# Patient Record
Sex: Female | Born: 2000 | Race: White | Hispanic: No | Marital: Single | Smoking: Never smoker
Health system: Southern US, Community
[De-identification: ages and names within clinical notes are randomized; demographics above are authoritative.]

## PROBLEM LIST (undated history)

## (undated) DIAGNOSIS — F419 Anxiety disorder, unspecified: Secondary | ICD-10-CM

## (undated) DIAGNOSIS — R590 Localized enlarged lymph nodes: Secondary | ICD-10-CM

## (undated) DIAGNOSIS — J0301 Acute recurrent streptococcal tonsillitis: Secondary | ICD-10-CM

## (undated) HISTORY — DX: Anxiety disorder, unspecified: F41.9

## (undated) HISTORY — PX: TYMPANOSTOMY TUBE PLACEMENT: SHX32

---

## 2021-11-04 DIAGNOSIS — J020 Streptococcal pharyngitis: Secondary | ICD-10-CM | POA: Diagnosis not present

## 2021-11-21 ENCOUNTER — Ambulatory Visit: Payer: 59 | Admitting: Nurse Practitioner

## 2021-11-21 ENCOUNTER — Other Ambulatory Visit: Payer: Self-pay

## 2021-11-21 ENCOUNTER — Encounter: Payer: Self-pay | Admitting: Nurse Practitioner

## 2021-11-21 VITALS — BP 119/71 | HR 80 | Temp 98.6°F | Resp 18 | Ht 61.0 in | Wt 116.2 lb

## 2021-11-21 DIAGNOSIS — R590 Localized enlarged lymph nodes: Secondary | ICD-10-CM | POA: Diagnosis not present

## 2021-11-21 DIAGNOSIS — J351 Hypertrophy of tonsils: Secondary | ICD-10-CM | POA: Diagnosis not present

## 2021-11-21 NOTE — Progress Notes (Signed)
@Patient  ID: , female    DOB: 12-11-2000, 21 y.o.   MRN: [id number] ? ?Chief Complaint  ?Patient presents with  ? Establish Care  ?  Pt stated she just got over strep a week ago. She is here for lump right side of her throat very sore, face swelling, has extreme fatigue.   ? ? ?Referring provider: ?No ref. provider found ? ? ?HPI ? ?Patient presents today for lump to right neck.  She states that this has been present for the past 3 years.  She recently finished antibiotics for strep throat.  Patient states that she does have fatigue at times.  Patient is concerned for thyroid disease because it runs in the family.  We discussed that we can check some basic labs today and get her back in for complete physical in the near future.  We will check her thyroid level.  Her tonsils have been noted to be enlarged.  We will place a referral to ENT for further evaluation of neck and tonsils. Denies f/c/s, n/v/d, hemoptysis, PND, chest pain or edema. ? ? ? ? ?Not on File ? ? ?There is no immunization history on file for this patient. ? ?Past Medical History:  ?Diagnosis Date  ? Anxiety   ? ? ?Tobacco History: ?Social History  ? ?Tobacco Use  ?Smoking Status Never  ?Smokeless Tobacco Never  ? ?Counseling given: Not Answered ? ? ?Outpatient Encounter Medications as of 11/21/2021  ?Medication Sig  ? norethindrone-ethinyl estradiol (LOESTRIN) 1-20 MG-MCG tablet Take 1 tablet by mouth daily.  ? norethindrone-ethinyl estradiol (LOESTRIN) 1-20 MG-MCG tablet   ? spironolactone (ALDACTONE) 25 MG tablet Take by mouth.  ? ?No facility-administered encounter medications on file as of 11/21/2021.  ? ? ? ?Review of Systems ? ?Review of Systems  ?Constitutional: Negative.   ?HENT: Negative.    ?Cardiovascular: Negative.   ?Gastrointestinal: Negative.   ?Allergic/Immunologic: Negative.   ?Neurological: Negative.   ?Psychiatric/Behavioral: Negative.     ? ? ? ?Physical Exam ? ?BP 119/71 (BP Location: Right Arm, Patient Position: Sitting,  Cuff Size: Normal)   Pulse 80   Temp 98.6 ?F (37 ?C)   Resp 18   Ht 5\' 1"  (1.549 m)   Wt 116 lb 4 oz (52.7 kg)   LMP 11/12/2021 (Approximate)   SpO2 98%   BMI 21.97 kg/m?  ? ?Wt Readings from Last 5 Encounters:  ?11/21/21 116 lb 4 oz (52.7 kg)  ? ? ? ?Physical Exam ?Vitals and nursing note reviewed.  ?Constitutional:   ?   General: She is not in acute distress. ?   Appearance: She is well-developed.  ?HENT:  ?   Mouth/Throat:  ?   Comments: Tonsillar lymph node slightly enlarged. Bilateral tonsils enlarged.  ?Cardiovascular:  ?   Rate and Rhythm: Normal rate and regular rhythm.  ?Pulmonary:  ?   Effort: Pulmonary effort is normal.  ?   Breath sounds: Normal breath sounds.  ?Neurological:  ?   Mental Status: She is alert and oriented to person, place, and time.  ? ? ? ? ? ?Assessment & Plan:  ? ?Enlarged lymph node in neck ?Issues x 3 years - will refer to ENT for further evaluation ? ?- Ambulatory referral to ENT ?- CBC ?- Comprehensive metabolic panel ?- TSH ? ? ?2. Enlarged tonsils ? ? ?- Ambulatory referral to ENT ?- CBC ?- Comprehensive metabolic panel ?- TSH ? ? ?Follow up: ? ?Follow up  ? ? ? ? ?11/14/2021,  NP ?11/25/2021 ? ?

## 2021-11-21 NOTE — Patient Instructions (Signed)
1. Enlarged lymph node in neck ? ?Issues x 3 years - will refer to ENT for further evaluation ? ?- Ambulatory referral to ENT ?- CBC ?- Comprehensive metabolic panel ?- TSH ? ? ?2. Enlarged tonsils ? ? ?- Ambulatory referral to ENT ?- CBC ?- Comprehensive metabolic panel ?- TSH ? ? ?Follow up: ? ?Follow up  ?

## 2021-11-22 LAB — COMPREHENSIVE METABOLIC PANEL
ALT: 11 IU/L (ref 0–32)
AST: 16 IU/L (ref 0–40)
Albumin/Globulin Ratio: 1.7 (ref 1.2–2.2)
Albumin: 4.3 g/dL (ref 3.9–5.0)
Alkaline Phosphatase: 74 IU/L (ref 44–121)
BUN/Creatinine Ratio: 13 (ref 9–23)
BUN: 12 mg/dL (ref 6–20)
Bilirubin Total: 0.3 mg/dL (ref 0.0–1.2)
CO2: 22 mmol/L (ref 20–29)
Calcium: 9.9 mg/dL (ref 8.7–10.2)
Chloride: 101 mmol/L (ref 96–106)
Creatinine, Ser: 0.95 mg/dL (ref 0.57–1.00)
Globulin, Total: 2.5 g/dL (ref 1.5–4.5)
Glucose: 66 mg/dL — ABNORMAL LOW (ref 70–99)
Potassium: 4.3 mmol/L (ref 3.5–5.2)
Sodium: 138 mmol/L (ref 134–144)
Total Protein: 6.8 g/dL (ref 6.0–8.5)
eGFR: 87 mL/min/{1.73_m2} (ref >59)

## 2021-11-22 LAB — CBC
Hematocrit: 42.8 % (ref 34.0–46.6)
Hemoglobin: 15 g/dL (ref 11.1–15.9)
MCH: 34.1 pg — ABNORMAL HIGH (ref 26.6–33.0)
MCHC: 35 g/dL (ref 31.5–35.7)
MCV: 97 fL (ref 79–97)
Platelets: 266 10*3/uL (ref 150–450)
RBC: 4.4 x10E6/uL (ref 3.77–5.28)
RDW: 11.8 % (ref 11.7–15.4)
WBC: 8.1 10*3/uL (ref 3.4–10.8)

## 2021-11-22 LAB — TSH: TSH: 2.14 u[IU]/mL (ref 0.450–4.500)

## 2021-11-25 ENCOUNTER — Encounter: Payer: Self-pay | Admitting: Nurse Practitioner

## 2021-11-25 DIAGNOSIS — R590 Localized enlarged lymph nodes: Secondary | ICD-10-CM | POA: Insufficient documentation

## 2021-11-25 NOTE — Assessment & Plan Note (Signed)
Issues x 3 years - will refer to ENT for further evaluation ? ?- Ambulatory referral to ENT ?- CBC ?- Comprehensive metabolic panel ?- TSH ? ? ?2. Enlarged tonsils ? ? ?- Ambulatory referral to ENT ?- CBC ?- Comprehensive metabolic panel ?- TSH ? ? ?Follow up: ? ?Follow up  ?

## 2021-12-03 ENCOUNTER — Ambulatory Visit: Payer: Self-pay | Admitting: *Deleted

## 2021-12-03 NOTE — Telephone Encounter (Signed)
?  Chief Complaint: enlarged lymph nodes bigger , requesting advise  ?Symptoms: right side of neck lymph nodes swollen size of golf ball, left side swollen less. Facial swelling noted. Low grade fever, headache, tender to touch, pain moving neck side to side.  ?Frequency: bigger since 11/21/21 ?Pertinent Negatives: Patient denies chest pain  no difficulty breathing, no difficulty swallowing  ?Disposition: [x] ED /[] Urgent Care (no appt availability in office) / [] Appointment(In office/virtual)/ []  [facility]/ [] Home Care/ [] Refused Recommended Disposition /[] [facility]/ []  Follow-up with PCP ?Additional Notes:  ?Awaiting ENT to call for appt.  ? ? ? Reason for Disposition ? [1] Single large node AND [2] size > 1 inch (2.5 cm) AND [3] no fever ? ?Answer Assessment - Initial Assessment Questions ?1. LOCATION: "Where is the swollen node located?" "Is the matching node on the other side of the body also swollen?"  ?    Right side of neck lymph node larger than left side ?2. SIZE: "How big is the node?" (e.g., inches or centimeters; or compared to common objects such as pea, bean, marble, golf ball)  ?    Right lymph node golf ball size ?3. ONSET: "When did the swelling start?"  ?    Has been swelling since 11/21/21 and getting larger ?4. NECK NODES: "Is there a sore throat, runny nose or other symptoms of a cold?"  ?    Was treated with antibiotics and seen in [facility] 11/21/21 ?5. GROIN OR ARMPIT NODES: "Is there a sore, scratch, cut or painful red area on that arm or leg?"  ?    No  ?6. FEVER: "Do you have a fever?" If Yes, ask: "What is it, how was it measured, and when did it start?"  ?    Low grade fever ?7. CAUSE: "What do you think is causing the swollen lymph nodes?" ?    Not sure  ?8. OTHER SYMPTOMS: "Do you have any other symptoms?" ?    Tonsils inflammed.  Facial swelling headache, neck stiffness moving side to side. ?9. PREGNANCY: "Is there any chance you are pregnant?" "When was your last  menstrual period?" ?    na ? ?Protocols used: Lymph Nodes - Swollen-A-AH ? ?

## 2021-12-04 ENCOUNTER — Emergency Department (HOSPITAL_COMMUNITY): Payer: 59

## 2021-12-04 ENCOUNTER — Emergency Department (HOSPITAL_COMMUNITY)
Admission: EM | Admit: 2021-12-04 | Discharge: 2021-12-04 | Disposition: A | Discharge status: Home / Self Care | Payer: 59 | Attending: Emergency Medicine | Admitting: Emergency Medicine

## 2021-12-04 ENCOUNTER — Other Ambulatory Visit: Payer: Self-pay

## 2021-12-04 DIAGNOSIS — R590 Localized enlarged lymph nodes: Secondary | ICD-10-CM | POA: Insufficient documentation

## 2021-12-04 DIAGNOSIS — J358 Other chronic diseases of tonsils and adenoids: Secondary | ICD-10-CM | POA: Insufficient documentation

## 2021-12-04 DIAGNOSIS — J351 Hypertrophy of tonsils: Secondary | ICD-10-CM | POA: Diagnosis not present

## 2021-12-04 DIAGNOSIS — M542 Cervicalgia: Secondary | ICD-10-CM | POA: Diagnosis present

## 2021-12-04 LAB — CBC WITH DIFFERENTIAL/PLATELET
Abs Immature Granulocytes: 0.02 10*3/uL (ref 0.00–0.07)
Basophils Absolute: 0 10*3/uL (ref 0.0–0.1)
Basophils Relative: 1 %
Eosinophils Absolute: 0.1 10*3/uL (ref 0.0–0.5)
Eosinophils Relative: 1 %
HCT: 40.6 % (ref 36.0–46.0)
Hemoglobin: 14.5 g/dL (ref 12.0–15.0)
Immature Granulocytes: 0 %
Lymphocytes Relative: 21 %
Lymphs Abs: 1.7 10*3/uL (ref 0.7–4.0)
MCH: 33.6 pg (ref 26.0–34.0)
MCHC: 35.7 g/dL (ref 30.0–36.0)
MCV: 94.2 fL (ref 80.0–100.0)
Monocytes Absolute: 0.6 10*3/uL (ref 0.1–1.0)
Monocytes Relative: 8 %
Neutro Abs: 5.5 10*3/uL (ref 1.7–7.7)
Neutrophils Relative %: 69 %
Platelets: 260 10*3/uL (ref 150–400)
RBC: 4.31 MIL/uL (ref 3.87–5.11)
RDW: 11.8 % (ref 11.5–15.5)
WBC: 8 10*3/uL (ref 4.0–10.5)
nRBC: 0 % (ref 0.0–0.2)

## 2021-12-04 LAB — I-STAT BETA HCG BLOOD, ED (MC, WL, AP ONLY): I-stat hCG, quantitative: <5 m[IU]/mL (ref <5)

## 2021-12-04 LAB — BASIC METABOLIC PANEL
Anion gap: 7 (ref 5–15)
BUN: 9 mg/dL (ref 6–20)
CO2: 27 mmol/L (ref 22–32)
Calcium: 9.3 mg/dL (ref 8.9–10.3)
Chloride: 106 mmol/L (ref 98–111)
Creatinine, Ser: 0.82 mg/dL (ref 0.44–1.00)
GFR, Estimated: >60 mL/min (ref >60)
Glucose, Bld: 89 mg/dL (ref 70–99)
Potassium: 4.4 mmol/L (ref 3.5–5.1)
Sodium: 140 mmol/L (ref 135–145)

## 2021-12-04 LAB — GROUP A STREP BY PCR: Group A Strep by PCR: DETECTED — AB

## 2021-12-04 LAB — MONONUCLEOSIS SCREEN: Mono Screen: NEGATIVE

## 2021-12-04 MED ORDER — CYCLOBENZAPRINE HCL 10 MG PO TABS: 10.0000 mg | ORAL_TABLET | Freq: Every evening | ORAL | 0 refills | Status: AC | PRN

## 2021-12-04 MED ORDER — IOHEXOL 300 MG/ML  SOLN
75.0000 mL | Freq: Once | INTRAMUSCULAR | Status: AC | PRN
  Administered 2021-12-04: 75 mL via INTRAVENOUS

## 2021-12-04 NOTE — ED Triage Notes (Signed)
Pt. Stated, Desiree Ramsey had a lump in my neck for 2-3 years and now Im having neck pain for 4 days. ?

## 2021-12-04 NOTE — ED Provider Notes (Signed)
?[facility] EMERGENCY DEPARTMENT ?Provider Note ? ? ?CSN: [id number] ?Arrival date & time: 12/04/21  1044 ? ?  ? ?History ? ?Chief Complaint  ?Patient presents with  ? Neck Pain  ? ? ?Desiree Ramsey is a 21 y.o. female. ? ?Patient is a 21 year old female with no significant medical problems presenting today with concerns for neck pain, swelling in her neck and recurrent ongoing illnesses including strep throat last week.  Patient was on 10 days of antibiotic which she was taking twice daily but noticed in the last week she has had worsening swelling in her neck which makes it painful for her to lay down and move her head.  She has been taking over-the-counter medications with minimal improvement.  She has not had any fever, cough or congestion.  She occasionally has pain with swallowing but reports that is significantly improved after being on the antibiotic.  She reports for several years now she has noticed an intermittent lymph node in the right side of her neck that will swell when she gets sick but does not consistently stay enlarged.  It is never hurt before.  She denies any recent weight loss.  She had followed with her pediatrician for this and they never found any acute issues.  She denies any abdominal pain, weight loss, nausea or vomiting.  No shortness of breath or respiratory symptoms. ? ?The history is provided by the patient.  ?Neck Pain ? ?  ? ?Home Medications ?Prior to Admission medications   ?Medication Sig Start Date End Date Taking? Authorizing Provider  ?cyclobenzaprine (FLEXERIL) 10 MG tablet Take 1 tablet (10 mg total) by mouth at bedtime as needed for muscle spasms. 12/04/21  Yes Gwyneth Sprout, MD  ?norethindrone-ethinyl estradiol (LOESTRIN) 1-20 MG-MCG tablet Take 1 tablet by mouth daily. 09/18/21   [provider]  ?norethindrone-ethinyl estradiol (LOESTRIN) 1-20 MG-MCG tablet  01/09/21   [provider]  ?spironolactone (ALDACTONE) 25 MG tablet Take by  mouth.    [provider]  ?   ? ?Allergies    ?Patient has no allergy information on record.   ? ?Review of Systems   ?Review of Systems  ?Musculoskeletal:  Positive for neck pain.  ? ?Physical Exam ?Updated Vital Signs ?BP 108/63   Pulse 80   Temp 98.5 ?F (36.9 ?C) (Oral)   Resp 16   LMP 11/12/2021 (Approximate)   SpO2 98%  ?Physical Exam ?Vitals and nursing note reviewed.  ?Constitutional:   ?   General: She is not in acute distress. ?   Appearance: She is well-developed.  ?HENT:  ?   Head: Normocephalic and atraumatic.  ?   Mouth/Throat:  ?   Comments: No tonsillar exudates, enlargement or swelling ?Eyes:  ?   Pupils: Pupils are equal, round, and reactive to light.  ?Neck:  ?   Thyroid: No thyromegaly or thyroid tenderness.  ? ?   Comments: No supraclavicular lymph nodes present.  No stridor or voice changes. ?Cardiovascular:  ?   Rate and Rhythm: Normal rate and regular rhythm.  ?   Heart sounds: Normal heart sounds. No murmur heard. ?  No friction rub.  ?Pulmonary:  ?   Effort: Pulmonary effort is normal.  ?   Breath sounds: Normal breath sounds. No wheezing or rales.  ?Abdominal:  ?   General: Bowel sounds are normal. There is no distension.  ?   Palpations: Abdomen is soft.  ?   Tenderness: There is no abdominal tenderness. There is  no guarding or rebound.  ?Musculoskeletal:     ?   General: No tenderness. Normal range of motion.  ?   Cervical back: Normal range of motion and neck supple.  ?   Right lower leg: No edema.  ?   Left lower leg: No edema.  ?   Comments: No edema  ?Lymphadenopathy:  ?   Cervical: Cervical adenopathy present.  ?   Right cervical: Superficial cervical adenopathy present.  ?   Left cervical: Superficial cervical adenopathy present.  ?Skin: ?   General: Skin is warm and dry.  ?   Findings: No rash.  ?Neurological:  ?   Mental Status: She is alert and oriented to person, place, and time.  ?   Cranial Nerves: No cranial nerve deficit.  ?Psychiatric:     ?   Behavior:  Behavior normal.  ? ? ?ED Results / Procedures / Treatments   ?Labs ?(all labs ordered are listed, but only abnormal results are displayed) ?Labs Reviewed  ?GROUP A STREP BY PCR - Abnormal; Notable for the following components:  ?    Result Value  ? Group A Strep by PCR DETECTED (*)   ? All other components within normal limits  ?CBC WITH DIFFERENTIAL/PLATELET  ?BASIC METABOLIC PANEL  ?MONONUCLEOSIS SCREEN  ?I-STAT BETA HCG BLOOD, ED ([facility], [facility], [facility] ONLY)  ? ? ?EKG ?None ? ?Radiology ?CT Soft Tissue Neck W Contrast ? ?Result Date: 12/04/2021 ?CLINICAL DATA:  Soft tissue swelling, infection suspected, neck xray done EXAM: CT NECK WITH CONTRAST TECHNIQUE: Multidetector CT imaging of the neck was performed using the standard protocol following the bolus administration of intravenous contrast. RADIATION DOSE REDUCTION: This exam was performed according to the departmental dose-optimization program which includes automated exposure control, adjustment of the mA and/or kV according to patient size and/or use of iterative reconstruction technique. CONTRAST:  75mL OMNIPAQUE IOHEXOL 300 MG/ML  SOLN COMPARISON:  None. FINDINGS: Pharynx and larynx: Enlarged palatine tonsils bilaterally. No surrounding edema or discrete, drainable fluid collection. Unremarkable appearance of the larynx. Salivary glands: No inflammation, mass, or stone. Thyroid: Normal. Lymph nodes: Enlarged left greater than right upper cervical chain nodes, including 1.6 x 3.1 Cm short axis left upper neck node. Vascular: Negative. Limited intracranial: Negative. Visualized orbits: Negative. Mastoids and visualized paranasal sinuses: Clear. Skeleton: No evidence of acute abnormality. Upper chest: Visualized lung apices are clear. IMPRESSION: 1. Enlarged bilateral cervical chain nodes, largest being a node in the upper left neck. Differential considerations include reactive adenopathy versus lymphoproliferative disorder. Soft tissue sampling may be indicated if they  don't resolve with conservative management. 2. Mildly prominent palatine tonsils, which is nonspecific. Recommend correlation with direct inspection. Electronically Signed   By: Feliberto HartsFrederick S Jones M.D.   On: 12/04/2021 14:47   ? ?Procedures ?Procedures  ? ? ?Medications Ordered in ED ?Medications  ?iohexol (OMNIPAQUE) 300 MG/ML solution 75 mL (75 mLs Intravenous Contrast Given 12/04/21 1426)  ? ? ?ED Course/ Medical Decision Making/ A&P ?  ?                        ?Medical Decision Making ?Amount and/or Complexity of Data Reviewed ?External Data Reviewed: notes. ?Labs: ordered. Decision-making details documented in ED Course. ?Radiology: ordered and independent interpretation performed. Decision-making details documented in ED Course. ? ?Risk ?Prescription drug management. ? ? ?21 year old female with recent strep infection presenting today due to anterior neck pain and mass.  Patient has mass noted in the left  anterior neck concern for possible enlarged parotid gland.  Also lymph node palpated in the right side of the neck.  Tenderness with palpation.  Concern for possible viral etiology versus lymphoma versus parotitis versus mono.  Patient did test strep positive however she was just treated for strep and does not have significant findings of strep at this time.  Low suspicion for RPA, epiglottitis or PTA.  Discussed with the patient and her mom further evaluation with CT soft tissue neck for further evaluation.  I independently reviewed patient's labs and her white count is within normal limits without signs concerning for leukemia, CMP within normal limits.  Patient's mono is negative.  I independently visualized and interpreted patient's CT without evidence of abscess.  Radiology reports 1. Enlarged bilateral cervical chain nodes, largest being a node in  ?the upper left neck. Differential considerations include reactive  ?adenopathy versus lymphoproliferative disorder. Soft tissue sampling  ?may be indicated if  they don't resolve with conservative management.  ?2. Mildly prominent palatine tonsils, which is nonspecific.  ?Recommend correlation with direct inspection.  Findings discussed with the patient and her mom.  F

## 2021-12-05 ENCOUNTER — Encounter: Payer: Self-pay | Admitting: Nurse Practitioner

## 2021-12-05 ENCOUNTER — Ambulatory Visit (INDEPENDENT_AMBULATORY_CARE_PROVIDER_SITE_OTHER): Payer: 59 | Admitting: Nurse Practitioner

## 2021-12-05 DIAGNOSIS — R590 Localized enlarged lymph nodes: Secondary | ICD-10-CM

## 2021-12-05 NOTE — Progress Notes (Signed)
Virtual Visit via Telephone Note ? ?I connected with Desiree Ramsey on 12/05/21 at  3:00 PM EDT by telephone and verified that I am speaking with the correct person using two identifiers. ? ?Location: ?Patient: home ?Provider: office ?  ?I discussed the limitations, risks, security and privacy concerns of performing an evaluation and management service by telephone and the availability of in person appointments. I also discussed with the patient that there may be a patient responsible charge related to this service. The patient expressed understanding and agreed to proceed. ? ? ?History of Present Illness: ? ?Patient presents today for follow-up visit through telephone visit.  Patient was seen by me on 11/21/2021 for enlarged lymph nodes and enlarged tonsils.  A referral was placed at that visit for ENT.  Patient states that she never heard back from the ENT for the appointment.  She states that she felt like her lymph nodes were becoming more inflamed and enlarged.  She went to the ED yesterday and CT showed enlarged lymph nodes and suggested possible need for biopsy.  Patient is following up today to see if we could possibly get her an appointment made as soon as possible with the ENT to follow-up on this issue.  She does have an appointment set up for 01/21/2022 as per referral placed at her last visit.  We will call the ENT office to see if we can have her seen urgently.  She has been having this issue for the past 3 years.  She did recently have strep throat which she has been treated for with a round of antibiotics. Denies f/c/s, n/v/d, hemoptysis, PND, chest pain or edema. ? ? ?  ?Observations/Objective: ? ? ?  12/04/2021  ? 10:51 AM 11/21/2021  ? 10:13 AM  ?Vitals with BMI  ?Height  5\' 1"   ?Weight  116 lbs 4 oz  ?BMI  21.98  ?Systolic 108 119  ?Diastolic 63 71  ?Pulse 80 80  ? ? ? ? ?Assessment and Plan: ? ?1. Enlarged lymph node in neck ? ?Will call ENT to see if appt can be re-scheduled for a sooner date ? ?Follow  up: ? ?Follow up if needed ? ? ?  ?I discussed the assessment and treatment plan with the patient. The patient was provided an opportunity to ask questions and all were answered. The patient agreed with the plan and demonstrated an understanding of the instructions. ?  ?The patient was advised to call back or seek an in-person evaluation if the symptoms worsen or if the condition fails to improve as anticipated. ? ?I provided 25 minutes of non-face-to-face time during this encounter. ? ? ? , NP ? ?

## 2021-12-05 NOTE — Patient Instructions (Addendum)
1. Enlarged lymph node in neck ? ?Will call ENT to see if appt can be re-scheduled for a sooner date ? ?Follow up: ? ?Follow up if needed ? ? ?

## 2021-12-05 NOTE — Progress Notes (Signed)
Patient reports swollen lymph nodes recurring after visit with Tanda Rockers on 11/21/21.  ?MA spoke with ENT and patient is scheduled for 01/21/22 at 11am with Brynda Peon at [facility] ?

## 2021-12-12 DIAGNOSIS — R590 Localized enlarged lymph nodes: Secondary | ICD-10-CM | POA: Diagnosis not present

## 2021-12-12 DIAGNOSIS — Z87898 Personal history of other specified conditions: Secondary | ICD-10-CM | POA: Diagnosis not present

## 2021-12-12 DIAGNOSIS — J0301 Acute recurrent streptococcal tonsillitis: Secondary | ICD-10-CM | POA: Diagnosis not present

## 2022-01-01 NOTE — Progress Notes (Deleted)
Patient ID: Desiree Ramsey, female    DOB: 02/12/2001  MRN: [id number]  CC: Annual Physical Exam  Subjective: Desiree Ramsey is a 21 y.o. female who presents for annual physical exam.   Her concerns today include:  Need PAP   Patient Active Problem List   Diagnosis Date Noted   Enlarged lymph node in neck 11/25/2021     Current Outpatient Medications on File Prior to Visit  Medication Sig Dispense Refill   cyclobenzaprine (FLEXERIL) 10 MG tablet Take 1 tablet (10 mg total) by mouth at bedtime as needed for muscle spasms. 20 tablet 0   norethindrone-ethinyl estradiol (LOESTRIN) 1-20 MG-MCG tablet Take 1 tablet by mouth daily.     norethindrone-ethinyl estradiol (LOESTRIN) 1-20 MG-MCG tablet      spironolactone (ALDACTONE) 25 MG tablet Take by mouth.     No current facility-administered medications on file prior to visit.    Not on File  Social History   Socioeconomic History   Marital status: Single    Spouse name: Not on file   Number of children: Not on file   Years of education: Not on file   Highest education level: Not on file  Occupational History   Not on file  Tobacco Use   Smoking status: Never   Smokeless tobacco: Never  Vaping Use   Vaping Use: Every day  Substance and Sexual Activity   Alcohol use: Yes    Comment: social drinker   Drug use: Yes    Types: Marijuana   Sexual activity: Yes  Other Topics Concern   Not on file  Social History Narrative   Not on file   Social Determinants of Health   Financial Resource Strain: Not on file  Food Insecurity: Not on file  Transportation Needs: Not on file  Physical Activity: Not on file  Stress: Not on file  Social Connections: Not on file  Intimate Partner Violence: Not on file    Family History  Problem Relation Age of Onset   Hypertension Father     No past surgical history on file.  ROS: Review of Systems Negative except as stated above  PHYSICAL EXAM: There were no vitals taken for  this visit.  Physical Exam  {female adult master:310786} {female adult master:310785}     Latest Ref Rng & Units 12/04/2021   11:26 AM 11/21/2021   10:53 AM  CMP  Glucose 70 - 99 mg/dL 89   66    BUN 6 - 20 mg/dL 9   12    Creatinine 0.44 - 1.00 mg/dL 0.82   0.95    Sodium 135 - 145 mmol/L 140   138    Potassium 3.5 - 5.1 mmol/L 4.4   4.3    Chloride 98 - 111 mmol/L 106   101    CO2 22 - 32 mmol/L 27   22    Calcium 8.9 - 10.3 mg/dL 9.3   9.9    Total Protein 6.0 - 8.5 g/dL  6.8    Total Bilirubin 0.0 - 1.2 mg/dL  0.3    Alkaline Phos 44 - 121 IU/L  74    AST 0 - 40 IU/L  16    ALT 0 - 32 IU/L  11     Lipid Panel  No results found for: CHOL, TRIG, HDL, CHOLHDL, VLDL, LDLCALC, LDLDIRECT  CBC    Component Value Date/Time   WBC 8.0 12/04/2021 1126   RBC 4.31 12/04/2021 1126   HGB  14.5 12/04/2021 1126   HGB 15.0 11/21/2021 1053   HCT 40.6 12/04/2021 1126   HCT 42.8 11/21/2021 1053   PLT 260 12/04/2021 1126   PLT 266 11/21/2021 1053   MCV 94.2 12/04/2021 1126   MCV 97 11/21/2021 1053   MCH 33.6 12/04/2021 1126   MCHC 35.7 12/04/2021 1126   RDW 11.8 12/04/2021 1126   RDW 11.8 11/21/2021 1053   LYMPHSABS 1.7 12/04/2021 1126   MONOABS 0.6 12/04/2021 1126   EOSABS 0.1 12/04/2021 1126   BASOSABS 0.0 12/04/2021 1126    ASSESSMENT AND PLAN:  There are no diagnoses linked to this encounter.   Patient was given the opportunity to ask questions.  Patient verbalized understanding of the plan and was able to repeat key elements of the plan. Patient was given clear instructions to go to Emergency Department or return to medical center if symptoms don't improve, worsen, or new problems develop.The patient verbalized understanding.   No orders of the defined types were placed in this encounter.    Requested Prescriptions    No prescriptions requested or ordered in this encounter    No follow-ups on file.  Camillia Herter, NP

## 2022-01-06 ENCOUNTER — Ambulatory Visit: Payer: 59 | Admitting: Family

## 2022-01-06 DIAGNOSIS — Z13220 Encounter for screening for lipoid disorders: Secondary | ICD-10-CM

## 2022-01-06 DIAGNOSIS — Z13228 Encounter for screening for other metabolic disorders: Secondary | ICD-10-CM

## 2022-01-06 DIAGNOSIS — Z131 Encounter for screening for diabetes mellitus: Secondary | ICD-10-CM

## 2022-01-06 DIAGNOSIS — Z113 Encounter for screening for infections with a predominantly sexual mode of transmission: Secondary | ICD-10-CM

## 2022-01-06 DIAGNOSIS — Z0000 Encounter for general adult medical examination without abnormal findings: Secondary | ICD-10-CM

## 2022-01-06 DIAGNOSIS — Z114 Encounter for screening for human immunodeficiency virus [HIV]: Secondary | ICD-10-CM

## 2022-01-06 DIAGNOSIS — Z1159 Encounter for screening for other viral diseases: Secondary | ICD-10-CM

## 2022-01-06 DIAGNOSIS — Z124 Encounter for screening for malignant neoplasm of cervix: Secondary | ICD-10-CM

## 2022-01-09 ENCOUNTER — Encounter (HOSPITAL_BASED_OUTPATIENT_CLINIC_OR_DEPARTMENT_OTHER): Payer: Self-pay | Admitting: Otolaryngology

## 2022-01-09 ENCOUNTER — Other Ambulatory Visit: Payer: Self-pay

## 2022-01-10 NOTE — H&P (Signed)
HPI:  ? ?Desiree Ramsey is a 21 y.o. female who presents as a consult Patient.  ? ?Referring Provider: Fabio Neighbors, F* ? ?Chief complaint: Lump in the neck and recurrent strep. ? ?HPI: Very healthy young lady, currently a Presenter, broadcasting, has been sick on a monthly basis for most of her life including upper respiratory infections and strep throat. Her last episode of strep was a few weeks ago. She discovered a large lymph node in her right neck about 3 years ago. About a week ago she was having so much pain in her neck that she went to the emergency department and had a CT which revealed bilateral adenopathy. He was much better today. She does not smoke. ? ?PMH/Meds/All/SocHx/FamHx/ROS:  ? ?History reviewed. No pertinent past medical history. ? ?History reviewed. No pertinent surgical history. ? ?No family history of bleeding disorders, wound healing problems or difficulty with anesthesia.  ? ?Social History  ? ?Socioeconomic History  ? Marital status: Single  ?Spouse name: Not on file  ? Number of children: Not on file  ? Years of education: Not on file  ? Highest education level: Not on file  ?Occupational History  ? Not on file  ?Tobacco Use  ? Smoking status: Never  ? Smokeless tobacco: Never  ?Vaping Use  ? Vaping Use: Former  ?Substance and Sexual Activity  ? Alcohol use: Yes  ? Drug use: Not on file  ? Sexual activity: Not on file  ?Other Topics Concern  ? Not on file  ?Social History Narrative  ? Not on file  ? ?Social Determinants of Health  ? ?Financial Resource Strain: Not on file  ?Food Insecurity: Not on file  ?Transportation Needs: Not on file  ?Physical Activity: Not on file  ?Stress: Not on file  ?Social Connections: Not on file  ?Housing Stability: Not on file  ? ?Current Outpatient Medications:  ? norethindrone-ethinyl estradiol (JUNEL 1/20) 1-20 mg-mcg per tablet, , Disp: , Rfl:  ? spironolactone (ALDACTONE) 25 MG tablet, Take 1 tablet (25 mg total) by mouth daily., Disp: , Rfl:  ?  escitalopram oxalate (LEXAPRO) 10 MG tablet, Take 1 tablet (10 mg total) by mouth daily., Disp: , Rfl:  ? fluticasone propionate (FLONASE) 50 mcg/actuation nasal spray, Administer 2 sprays into affected nostril., Disp: , Rfl:  ? levocetirizine (XYZAL) 5 MG tablet, Take 1 tablet (5 mg total) by mouth., Disp: , Rfl:  ? ?A complete ROS was performed with pertinent positives/negatives noted in the HPI. The remainder of the ROS are negative. ? ? ?Physical Exam:  ? ?There were no vitals taken for this visit. ? ?General: Healthy and alert, in no distress, breathing easily. Normal affect. In a pleasant mood. ?Head: Normocephalic, atraumatic. No masses, or scars. ?Eyes: Pupils are equal, and reactive to light. Vision is grossly intact. No spontaneous or gaze nystagmus. ?Ears: Ear canals are clear. Tympanic membranes are intact, with normal landmarks and the middle ears are clear and healthy. ?Hearing: Grossly normal. ?Nose: Nasal cavities are clear with healthy mucosa, no polyps or exudate. Airways are patent. ?Face: No masses or scars, facial nerve function is symmetric. ?Oral Cavity: No mucosal abnormalities are noted. Tongue with normal mobility. Dentition appears healthy. ?Oropharynx: Tonsils are symmetric. There are no mucosal masses identified. Tongue base appears normal and healthy. ?Larynx/Hypopharynx: deferred ?Chest: Deferred ?Neck: Bilateral large level 2 nodes, with symmetry, nontender, no other palpable masses, no cervical adenopathy, no thyroid nodules or enlargement. ?Neuro: Cranial nerves II-XII with normal function. ?Balance:  Normal gate. ?Other findings: none. ? ?Independent Review of Additional Tests or Records:  ?CT of neck: ? ?IMPRESSION:  ?1. Enlarged bilateral cervical chain nodes, largest being a node in  ?the upper left neck. Differential considerations include reactive  ?adenopathy versus lymphoproliferative disorder. Soft tissue sampling  ?may be indicated if they don't resolve with conservative  management.  ?2. Mildly prominent palatine tonsils, which is nonspecific.  ?Recommend correlation with direct inspection.  ? ?Procedures:  ?none ? ?Impression & Plans:  ?Bilateral cervical lymphadenopathy, in the presence of history of recurrent strep and other upper respiratory infections. She has had mono twice. She has had COVID 5 times. I would recommend tonsillectomy. I would also put her on an antibiotic now since her strep test was positive last week but she was not treated for some reason. I think we take her tonsils out and the lymphadenopathy resolves then we are finished. If not then we may have to work her up for lymphoproliferative disorder.Dalyn meets the indications for tonsillectomy. Risks and benefits were discussed in detail. All questions were answered. A handout was provided with additional details.  ?

## 2022-01-20 ENCOUNTER — Ambulatory Visit (HOSPITAL_BASED_OUTPATIENT_CLINIC_OR_DEPARTMENT_OTHER): Payer: 59 | Admitting: Anesthesiology

## 2022-01-20 ENCOUNTER — Encounter (HOSPITAL_BASED_OUTPATIENT_CLINIC_OR_DEPARTMENT_OTHER): Payer: Self-pay | Admitting: Otolaryngology

## 2022-01-20 ENCOUNTER — Encounter (HOSPITAL_BASED_OUTPATIENT_CLINIC_OR_DEPARTMENT_OTHER)
Admission: RE | Disposition: A | Discharge status: Home / Self Care | Payer: Self-pay | Source: Home / Self Care | Attending: Otolaryngology

## 2022-01-20 ENCOUNTER — Ambulatory Visit (HOSPITAL_BASED_OUTPATIENT_CLINIC_OR_DEPARTMENT_OTHER)
Admission: RE | Admit: 2022-01-20 | Discharge: 2022-01-20 | Disposition: A | Discharge status: Home / Self Care | Payer: 59 | Attending: Otolaryngology | Admitting: Otolaryngology

## 2022-01-20 DIAGNOSIS — J0301 Acute recurrent streptococcal tonsillitis: Secondary | ICD-10-CM | POA: Insufficient documentation

## 2022-01-20 DIAGNOSIS — Z8616 Personal history of COVID-19: Secondary | ICD-10-CM | POA: Insufficient documentation

## 2022-01-20 DIAGNOSIS — Z9089 Acquired absence of other organs: Secondary | ICD-10-CM

## 2022-01-20 DIAGNOSIS — F419 Anxiety disorder, unspecified: Secondary | ICD-10-CM | POA: Diagnosis not present

## 2022-01-20 DIAGNOSIS — R590 Localized enlarged lymph nodes: Secondary | ICD-10-CM | POA: Diagnosis not present

## 2022-01-20 DIAGNOSIS — Z8619 Personal history of other infectious and parasitic diseases: Secondary | ICD-10-CM | POA: Insufficient documentation

## 2022-01-20 HISTORY — DX: Localized enlarged lymph nodes: R59.0

## 2022-01-20 HISTORY — DX: Acute recurrent streptococcal tonsillitis: J03.01

## 2022-01-20 HISTORY — PX: TONSILLECTOMY: SHX5217

## 2022-01-20 LAB — POCT PREGNANCY, URINE: Preg Test, Ur: NEGATIVE

## 2022-01-20 SURGERY — TONSILLECTOMY
Anesthesia: General | Site: Mouth | Laterality: Bilateral

## 2022-01-20 MED ORDER — DEXTROSE-NACL 5-0.9 % IV SOLN: INTRAVENOUS | Status: DC

## 2022-01-20 MED ORDER — MULTIVITAMINS PO CAPS: 1.0000 | ORAL_CAPSULE | Freq: Every day | ORAL | Status: DC

## 2022-01-20 MED ORDER — ACETAMINOPHEN 10 MG/ML IV SOLN: 1000.0000 mg | Freq: Once | INTRAVENOUS | Status: DC | PRN

## 2022-01-20 MED ORDER — MIDAZOLAM HCL 5 MG/5ML IJ SOLN
INTRAMUSCULAR | Status: DC | PRN
  Administered 2022-01-20: 2 mg via INTRAVENOUS

## 2022-01-20 MED ORDER — PROPOFOL 10 MG/ML IV BOLUS
INTRAVENOUS | Status: AC
  Filled 2022-01-20: qty 20

## 2022-01-20 MED ORDER — FENTANYL CITRATE (PF) 100 MCG/2ML IJ SOLN
25.0000 ug | INTRAMUSCULAR | Status: DC | PRN
  Administered 2022-01-20: 50 ug via INTRAVENOUS
  Administered 2022-01-20 (×2): 25 ug via INTRAVENOUS

## 2022-01-20 MED ORDER — ONDANSETRON HCL 4 MG/2ML IJ SOLN
INTRAMUSCULAR | Status: DC | PRN
Start: 2022-01-20 — End: 2022-01-20
  Administered 2022-01-20: 4 mg via INTRAVENOUS

## 2022-01-20 MED ORDER — LIDOCAINE HCL (CARDIAC) PF 100 MG/5ML IV SOSY
PREFILLED_SYRINGE | INTRAVENOUS | Status: DC | PRN
  Administered 2022-01-20: 40 mg via INTRAVENOUS

## 2022-01-20 MED ORDER — FENTANYL CITRATE (PF) 100 MCG/2ML IJ SOLN
INTRAMUSCULAR | Status: AC
  Filled 2022-01-20: qty 2

## 2022-01-20 MED ORDER — DEXAMETHASONE SODIUM PHOSPHATE 4 MG/ML IJ SOLN
INTRAMUSCULAR | Status: DC | PRN
  Administered 2022-01-20: 8 mg via INTRAVENOUS

## 2022-01-20 MED ORDER — NORETHINDRONE ACET-ETHINYL EST 1-20 MG-MCG PO TABS
1.0000 | ORAL_TABLET | Freq: Every day | ORAL | Status: DC
Start: 2022-01-20 — End: 2022-01-20

## 2022-01-20 MED ORDER — ONDANSETRON 4 MG PO TBDP: 4.0000 mg | ORAL_TABLET | Freq: Three times a day (TID) | ORAL | 0 refills | Status: AC | PRN

## 2022-01-20 MED ORDER — FENTANYL CITRATE (PF) 100 MCG/2ML IJ SOLN
INTRAMUSCULAR | Status: DC | PRN
  Administered 2022-01-20: 25 ug via INTRAVENOUS
  Administered 2022-01-20: 50 ug via INTRAVENOUS
  Administered 2022-01-20: 25 ug via INTRAVENOUS
  Administered 2022-01-20 (×2): 50 ug via INTRAVENOUS

## 2022-01-20 MED ORDER — MIDAZOLAM HCL 2 MG/2ML IJ SOLN
INTRAMUSCULAR | Status: AC
Start: 2022-01-20 — End: ?
  Filled 2022-01-20: qty 2

## 2022-01-20 MED ORDER — CYCLOBENZAPRINE HCL 10 MG PO TABS: 10.0000 mg | ORAL_TABLET | Freq: Every evening | ORAL | Status: DC | PRN

## 2022-01-20 MED ORDER — LACTATED RINGERS IV SOLN: INTRAVENOUS | Status: DC

## 2022-01-20 MED ORDER — ROCURONIUM BROMIDE 100 MG/10ML IV SOLN
INTRAVENOUS | Status: DC | PRN
  Administered 2022-01-20: 50 mg via INTRAVENOUS

## 2022-01-20 MED ORDER — ONDANSETRON HCL 4 MG/2ML IJ SOLN: 4.0000 mg | INTRAMUSCULAR | Status: DC | PRN

## 2022-01-20 MED ORDER — HYDROCODONE-ACETAMINOPHEN 7.5-325 MG/15ML PO SOLN: 15.0000 mL | Freq: Four times a day (QID) | ORAL | 0 refills | Status: AC | PRN

## 2022-01-20 MED ORDER — PHENOL 1.4 % MT LIQD: 1.0000 | OROMUCOSAL | Status: DC | PRN

## 2022-01-20 MED ORDER — HYDROCODONE-ACETAMINOPHEN 7.5-325 MG/15ML PO SOLN
10.0000 mL | ORAL | Status: DC | PRN
  Administered 2022-01-20: 15 mL via ORAL
  Filled 2022-01-20: qty 15

## 2022-01-20 MED ORDER — PROPOFOL 10 MG/ML IV BOLUS
INTRAVENOUS | Status: DC | PRN
  Administered 2022-01-20: 150 mg via INTRAVENOUS

## 2022-01-20 MED ORDER — IBUPROFEN 100 MG/5ML PO SUSP
400.0000 mg | Freq: Four times a day (QID) | ORAL | Status: DC | PRN
Start: 2022-01-20 — End: 2022-01-20
  Administered 2022-01-20: 400 mg via ORAL
  Filled 2022-01-20: qty 20

## 2022-01-20 MED ORDER — DEXMEDETOMIDINE (PRECEDEX) IN NS 20 MCG/5ML (4 MCG/ML) IV SYRINGE
PREFILLED_SYRINGE | INTRAVENOUS | Status: DC | PRN
  Administered 2022-01-20 (×2): 4 ug via INTRAVENOUS

## 2022-01-20 MED ORDER — SUGAMMADEX SODIUM 200 MG/2ML IV SOLN
INTRAVENOUS | Status: DC | PRN
  Administered 2022-01-20: 200 mg via INTRAVENOUS

## 2022-01-20 MED ORDER — AMISULPRIDE (ANTIEMETIC) 5 MG/2ML IV SOLN: 10.0000 mg | Freq: Once | INTRAVENOUS | Status: DC | PRN

## 2022-01-20 MED ORDER — ONDANSETRON HCL 4 MG PO TABS: 4.0000 mg | ORAL_TABLET | ORAL | Status: DC | PRN

## 2022-01-20 MED ORDER — 0.9 % SODIUM CHLORIDE (POUR BTL) OPTIME
TOPICAL | Status: DC | PRN
  Administered 2022-01-20: 100 mL

## 2022-01-20 SURGICAL SUPPLY — 31 items
CANISTER SUCT 1200ML W/VALVE (MISCELLANEOUS) ×2 IMPLANT
CATH ROBINSON RED A/P 12FR (CATHETERS) ×2 IMPLANT
CLEANER CAUTERY TIP 5X5 PAD (MISCELLANEOUS) ×1 IMPLANT
COAGULATOR SUCT SWTCH 10FR 6 (ELECTROSURGICAL) ×2 IMPLANT
COVER BACK TABLE 60X90IN (DRAPES) ×2 IMPLANT
COVER MAYO STAND STRL (DRAPES) ×2 IMPLANT
DEFOGGER MIRROR 1QT (MISCELLANEOUS) IMPLANT
ELECT COATED BLADE 2.86 ST (ELECTRODE) ×2 IMPLANT
ELECT REM PT RETURN 9FT ADLT (ELECTROSURGICAL) ×2
ELECTRODE REM PT RTRN 9FT ADLT (ELECTROSURGICAL) IMPLANT
GAUZE SPONGE 4X4 12PLY STRL LF (GAUZE/BANDAGES/DRESSINGS) ×2 IMPLANT
GLOVE BIO SURGEON STRL SZ7 (GLOVE) ×1 IMPLANT
GLOVE BIOGEL PI IND STRL 7.0 (GLOVE) IMPLANT
GLOVE BIOGEL PI INDICATOR 7.0 (GLOVE) ×1
GLOVE SURG SYN 7.5  E (GLOVE) ×1
GLOVE SURG SYN 7.5 E (GLOVE) ×1 IMPLANT
GLOVE SURG SYN 7.5 PF PI (GLOVE) ×1 IMPLANT
GOWN STRL REUS W/ TWL LRG LVL3 (GOWN DISPOSABLE) ×2 IMPLANT
GOWN STRL REUS W/TWL LRG LVL3 (GOWN DISPOSABLE) ×4
MARKER SKIN DUAL TIP RULER LAB (MISCELLANEOUS) IMPLANT
NS IRRIG 1000ML POUR BTL (IV SOLUTION) ×2 IMPLANT
PAD CLEANER CAUTERY TIP 5X5 (MISCELLANEOUS) ×1
PENCIL FOOT CONTROL (ELECTRODE) ×2 IMPLANT
SHEET MEDIUM DRAPE 40X70 STRL (DRAPES) ×2 IMPLANT
SPONGE TONSIL 1 RF SGL (DISPOSABLE) IMPLANT
SPONGE TONSIL 1.25 RF SGL STRG (GAUZE/BANDAGES/DRESSINGS) IMPLANT
SYR BULB EAR ULCER 3OZ GRN STR (SYRINGE) ×2 IMPLANT
TOWEL GREEN STERILE FF (TOWEL DISPOSABLE) ×2 IMPLANT
TUBE CONNECTING 20X1/4 (TUBING) ×2 IMPLANT
TUBE SALEM SUMP 12R W/ARV (TUBING) IMPLANT
TUBE SALEM SUMP 16 FR W/ARV (TUBING) ×1 IMPLANT

## 2022-01-20 NOTE — Transfer of Care (Signed)
Immediate Anesthesia Transfer of Care Note ? ?Patient: Desiree Ramsey ? ?Procedure(s) Performed: TONSILLECTOMY (Bilateral: Mouth) ? ?Patient Location: PACU ? ?Anesthesia Type:General ? ?Level of Consciousness: awake and drowsy ? ?Airway & Oxygen Therapy: Patient Spontanous Breathing and Patient connected to face mask oxygen ? ?Post-op Assessment: Report given to RN and Post -op Vital signs reviewed and stable ? ?Post vital signs: Reviewed and stable ? ?Last Vitals:  ?Vitals Value Taken Time  ?BP 124/79 01/20/22 0935  ?Temp    ?Pulse 92 01/20/22 0937  ?Resp 13 01/20/22 0937  ?SpO2 100 % 01/20/22 0937  ?Vitals shown include unvalidated device data. ? ?Last Pain:  ?Vitals:  ? 01/20/22 0712  ?TempSrc: Oral  ?PainSc: 0-No pain  ?   ? ?  ? ?Complications: No notable events documented. ?

## 2022-01-20 NOTE — Op Note (Signed)
01/20/2022 ? ?9:25 AM ? ?PATIENT:  Desiree Ramsey  21 y.o. female ? ?PRE-OPERATIVE DIAGNOSIS:  Recurrent streptococcal tonsillitis ? ?POST-OPERATIVE DIAGNOSIS:  Recurrent streptococcal tonsillitis ? ?PROCEDURE:  Procedure(s): ?TONSILLECTOMY ? ?SURGEON:  Surgeon(s): ?Serena Colonel, MD ? ?ANESTHESIA:   General ? ?COUNTS: Correct ? ?EBL: 100 cc ? ? ?DICTATION: The patient was taken to the operating room and placed on the operating table in the supine position. Following induction of general endotracheal anesthesia, the table was turned and the patient was draped in a standard fashion. A Crowe-Davis mouthgag was inserted into the oral cavity and used to retract the tongue and mandible, then attached to the Mayo stand. ? ?The tonsillectomy was then performed using electrocautery dissection, carefully dissecting the avascular plane between the capsule and constrictor muscles. Cautery was used for completion of hemostasis. The tonsils were moderately sized , and were discarded. There was an unusual amount of bleeding which was ultimately controlled with extensive use of suction cautery. ? ?The pharynx was irrigated with saline and suctioned. An oral gastric tube was used to aspirate the contents of the stomach. The patient was then awakened from anesthesia and transferred to PACU in stable condition. ? ? ?PATIENT DISPOSITION:  To PACA, stable ?  ? ? ? ?

## 2022-01-20 NOTE — Anesthesia Preprocedure Evaluation (Signed)
Anesthesia Evaluation  ?Patient identified by MRN, date of birth, ID band ?Patient awake ? ? ? ?Reviewed: ?Allergy & Precautions, NPO status , Patient's Chart, lab work & pertinent test results ? ?Airway ?Mallampati: II ? ?TM Distance: >3 FB ?Neck ROM: Full ? ? ? Dental ?no notable dental hx. ? ?  ?Pulmonary ?neg pulmonary ROS,  ?  ?Pulmonary exam normal ? ? ? ? ? ? ? Cardiovascular ?negative cardio ROS ? ? ?Rhythm:Regular Rate:Normal ? ? ?  ?Neuro/Psych ?Anxiety negative neurological ROS ?   ? GI/Hepatic ?negative GI ROS, Neg liver ROS,   ?Endo/Other  ?negative endocrine ROS ? Renal/GU ?negative Renal ROS  ?negative genitourinary ?  ?Musculoskeletal ?Recurrent tonsillitis   ? Abdominal ?Normal abdominal exam  (+)   ?Peds ? Hematology ?negative hematology ROS ?(+)   ?Anesthesia Other Findings ? ? Reproductive/Obstetrics ? ?  ? ? ? ? ? ? ? ? ? ? ? ? ? ?  ?  ? ? ? ? ? ? ? ? ?Anesthesia Physical ?Anesthesia Plan ? ?ASA: 2 ? ?Anesthesia Plan: General  ? ?Post-op Pain Management:   ? ?Induction: Intravenous ? ?PONV Risk Score and Plan: 3 and Ondansetron, Dexamethasone, Midazolam and Treatment may vary due to age or medical condition ? ?Airway Management Planned: Mask and Oral ETT ? ?Additional Equipment: None ? ?Intra-op Plan:  ? ?Post-operative Plan: Extubation in OR ? ?Informed Consent: I have reviewed the patients History and Physical, chart, labs and discussed the procedure including the risks, benefits and alternatives for the proposed anesthesia with the patient or authorized representative who has indicated his/her understanding and acceptance.  ? ? ? ?Dental advisory given ? ?Plan Discussed with: CRNA ? ?Anesthesia Plan Comments: (Lab Results ?     Component                Value               Date                 ?     PREGTESTUR               NEGATIVE            01/20/2022           ?     HCG                      <5.0                12/04/2021          )  ? ? ? ? ? ? ?Anesthesia  Quick Evaluation ? ?

## 2022-01-20 NOTE — Anesthesia Procedure Notes (Signed)
Procedure Name: Intubation ?Date/Time: 01/20/2022 8:43 AM ?Performed by: Ezequiel Kayser, CRNA ?Pre-anesthesia Checklist: Patient identified, Emergency Drugs available, Suction available and Patient being monitored ?Patient Re-evaluated:Patient Re-evaluated prior to induction ?Oxygen Delivery Method: Circle System Utilized ?Preoxygenation: Pre-oxygenation with 100% oxygen ?Induction Type: IV induction ?Ventilation: Mask ventilation without difficulty ?Laryngoscope Size: Mac and 3 ?Grade View: Grade I ?Tube type: Oral ?Tube size: 7.0 mm ?Number of attempts: 1 ?Airway Equipment and Method: Stylet and Oral airway ?Placement Confirmation: ETT inserted through vocal cords under direct vision, positive ETCO2 and breath sounds checked- equal and bilateral ?Secured at: 21 cm ?Tube secured with: Tape ?Dental Injury: Teeth and Oropharynx as per pre-operative assessment  ? ? ? ? ?

## 2022-01-20 NOTE — Interval H&P Note (Signed)
History and Physical Interval Note: ? ?01/20/2022 ?8:15 AM ? ?Desiree Ramsey  has presented today for surgery, with the diagnosis of Recurrent streptococcal tonsillitis.  The various methods of treatment have been discussed with the patient and family. After consideration of risks, benefits and other options for treatment, the patient has consented to  Procedure(s): ?TONSILLECTOMY (Bilateral) as a surgical intervention.  The patient's history has been reviewed, patient examined, no change in status, stable for surgery.  I have reviewed the patient's chart and labs.  Questions were answered to the patient's satisfaction.   ? ? ?Serena Colonel ? ? ?

## 2022-01-20 NOTE — Discharge Instructions (Addendum)

## 2022-01-21 ENCOUNTER — Encounter (HOSPITAL_BASED_OUTPATIENT_CLINIC_OR_DEPARTMENT_OTHER): Payer: Self-pay | Admitting: Otolaryngology

## 2022-01-21 NOTE — Anesthesia Postprocedure Evaluation (Signed)
Anesthesia Post Note ? ?Patient: [place] Dalporto ? ?Procedure(s) Performed: TONSILLECTOMY (Bilateral: Mouth) ? ?  ? ?Patient location during evaluation: PACU ?Anesthesia Type: General ?Level of consciousness: awake and alert ?Pain management: pain level controlled ?Vital Signs Assessment: post-procedure vital signs reviewed and stable ?Respiratory status: spontaneous breathing, nonlabored ventilation, respiratory function stable and patient connected to nasal cannula oxygen ?Cardiovascular status: blood pressure returned to baseline and stable ?Postop Assessment: no apparent nausea or vomiting ?Anesthetic complications: no ? ? ?No notable events documented. ? ?Last Vitals:  ?Vitals:  ? 01/20/22 1430 01/20/22 1500  ?BP:  (!) 142/82  ?Pulse:  74  ?Resp:  16  ?Temp:  36.8 ?C  ?SpO2: 100% 100%  ?  ?Last Pain:  ?Vitals:  ? 01/20/22 1500  ?TempSrc:   ?PainSc: 4   ? ? ?  ?  ?  ?  ?  ?  ? ?[place] [facility] Archuleta ? ? ? ? ?

## 2022-04-20 DIAGNOSIS — N390 Urinary tract infection, site not specified: Secondary | ICD-10-CM | POA: Diagnosis not present

## 2022-05-23 DIAGNOSIS — Z124 Encounter for screening for malignant neoplasm of cervix: Secondary | ICD-10-CM | POA: Diagnosis not present

## 2022-05-23 DIAGNOSIS — Z23 Encounter for immunization: Secondary | ICD-10-CM | POA: Diagnosis not present

## 2022-05-23 DIAGNOSIS — R590 Localized enlarged lymph nodes: Secondary | ICD-10-CM | POA: Diagnosis not present

## 2022-05-23 DIAGNOSIS — Z3041 Encounter for surveillance of contraceptive pills: Secondary | ICD-10-CM | POA: Diagnosis not present

## 2022-05-23 DIAGNOSIS — J029 Acute pharyngitis, unspecified: Secondary | ICD-10-CM | POA: Diagnosis not present

## 2022-05-23 DIAGNOSIS — Z01419 Encounter for gynecological examination (general) (routine) without abnormal findings: Secondary | ICD-10-CM | POA: Diagnosis not present

## 2022-06-09 DIAGNOSIS — J029 Acute pharyngitis, unspecified: Secondary | ICD-10-CM | POA: Diagnosis not present

## 2022-06-13 DIAGNOSIS — J069 Acute upper respiratory infection, unspecified: Secondary | ICD-10-CM | POA: Diagnosis not present

## 2022-06-13 DIAGNOSIS — N898 Other specified noninflammatory disorders of vagina: Secondary | ICD-10-CM | POA: Diagnosis not present

## 2023-07-09 IMAGING — CT CT NECK W/ CM
3 of 4 series · 12 of 33 positions shown, 14 images · IV contrast (Omni 300)
Comparison: None.

CLINICAL DATA: Soft tissue swelling, infection suspected, neck xray
done

EXAM:
CT NECK WITH CONTRAST
TECHNIQUE: Multidetector CT imaging of the neck was performed using the
standard protocol following the bolus administration of intravenous
contrast.

[Series 4: sagittal · sagittal · 0.41mm/px · 5 of 81 slices shown, 6 images]
[im 27/81  bone]
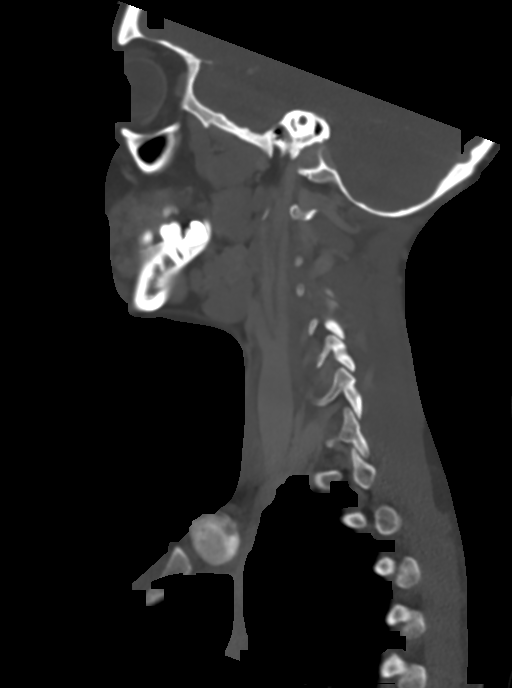
[im 34/81  bone]
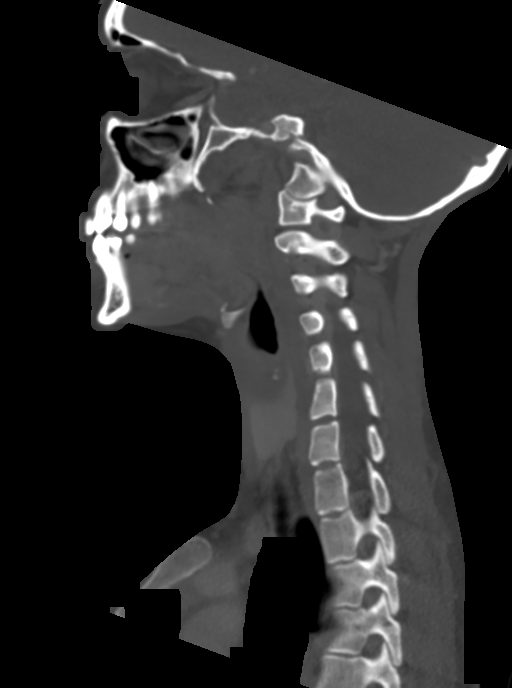
[im 41/81  soft-tissue]
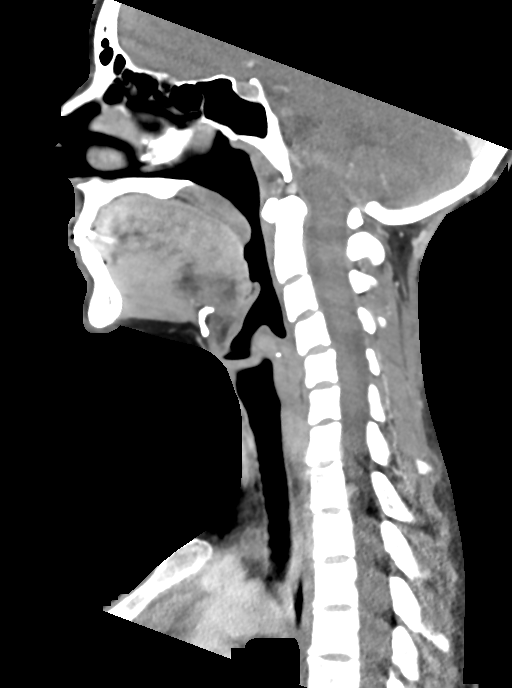
[im 41/81  bone]
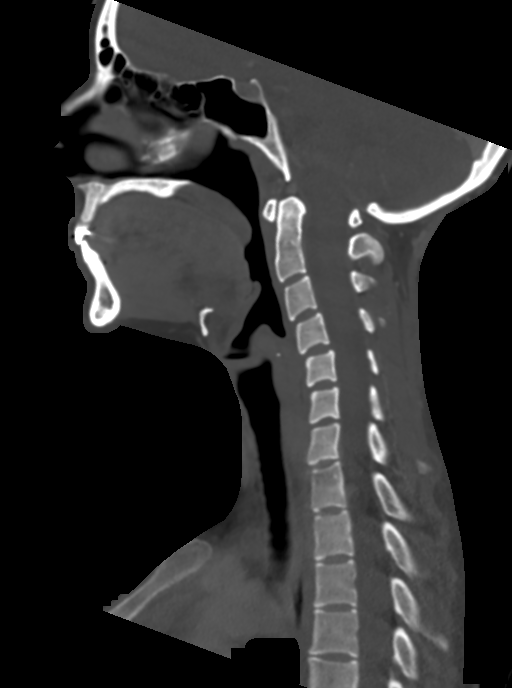
[im 47/81  bone]
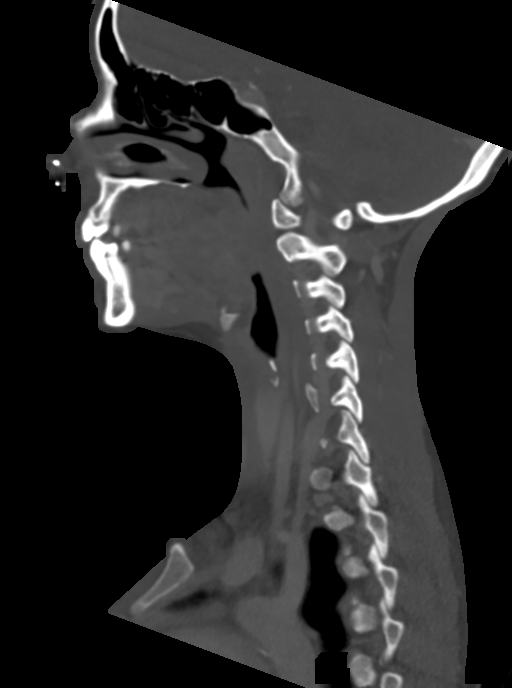
[im 54/81  bone]
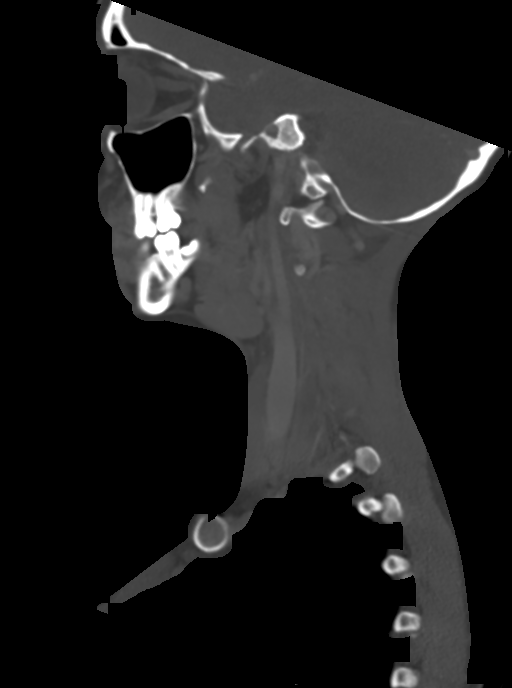

[Series 5: coronal · coronal · 0.32mm/px · 3 of 98 slices shown]
[im 31/98  bone]
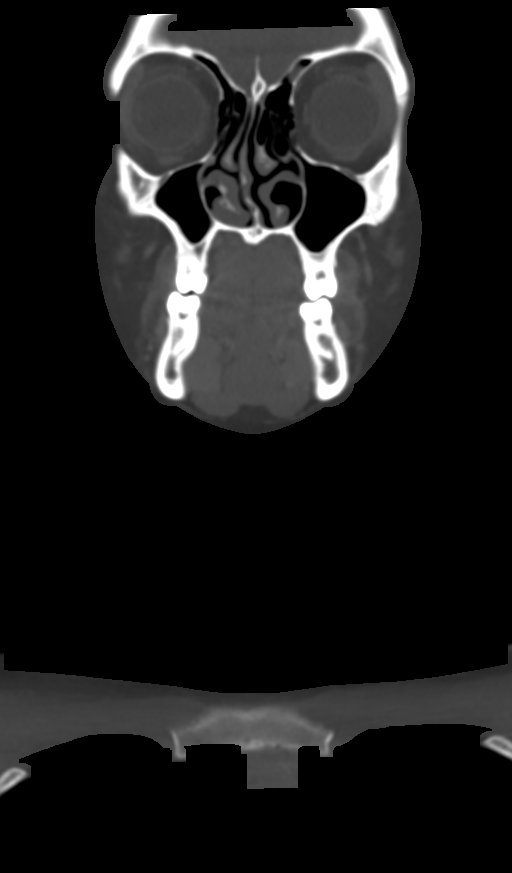
[im 43/98  bone]
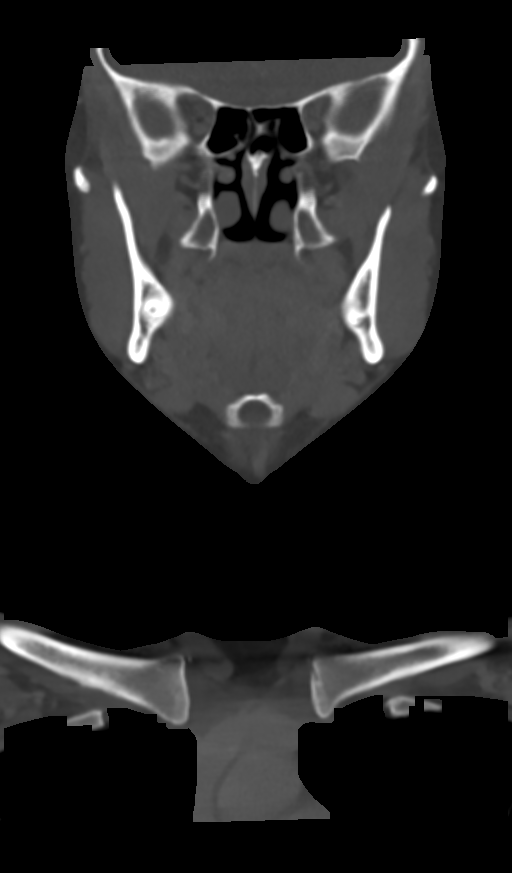
[im 55/98  bone]
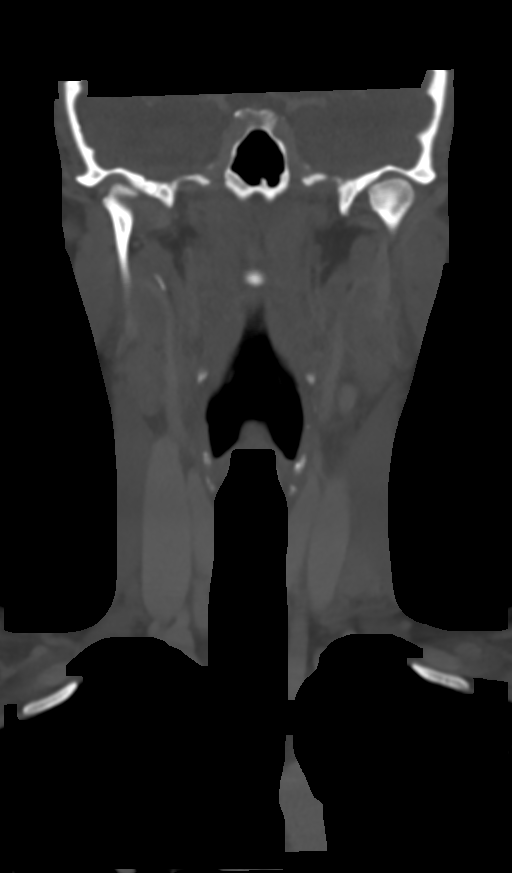

[Series 6: orthogonal · axial · 0.39mm/px · z∈[-278,-90]mm · 4 of 148 slices shown, 5 images]
[im 22/148  soft-tissue]
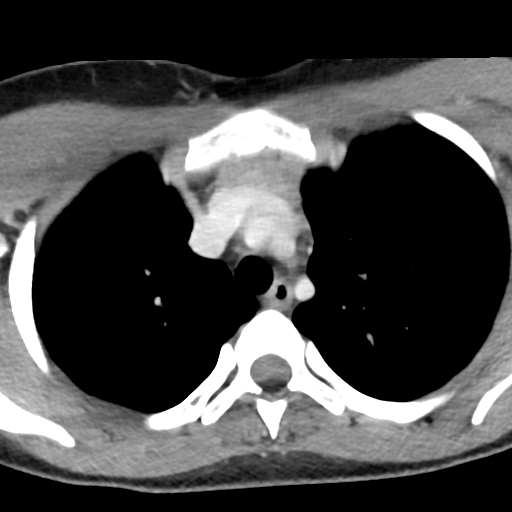
[im 22/148  bone]
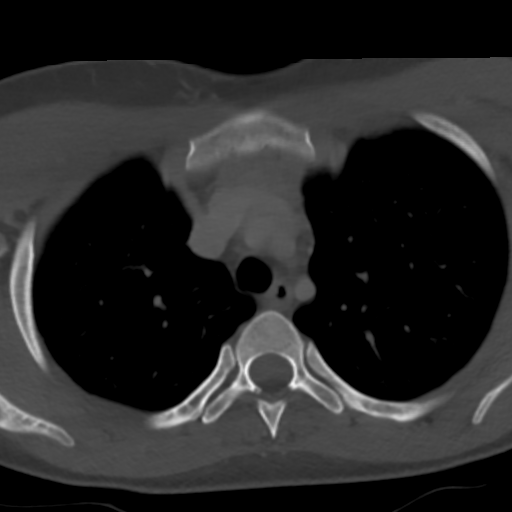
[im 64/148  bone]
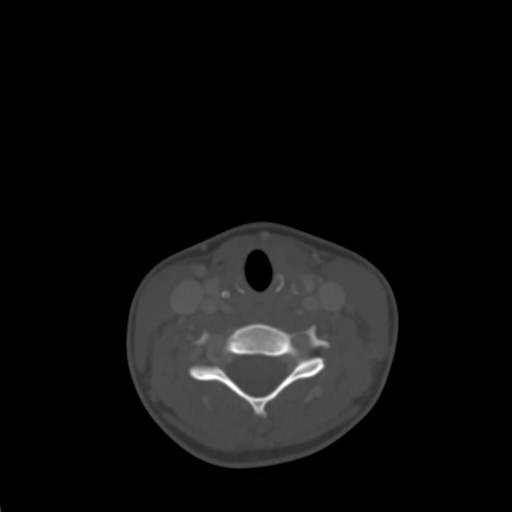
[im 85/148  bone]
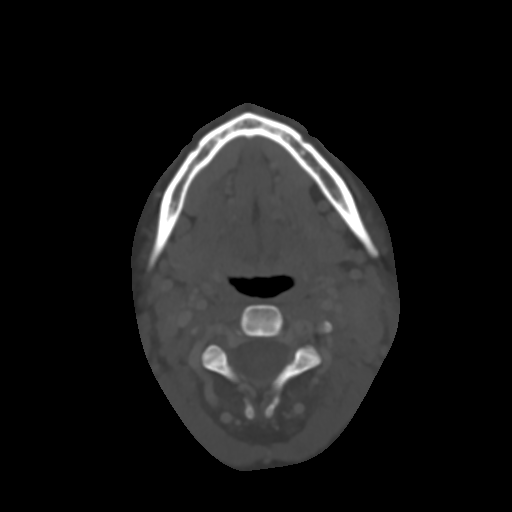
[im 127/148  bone]
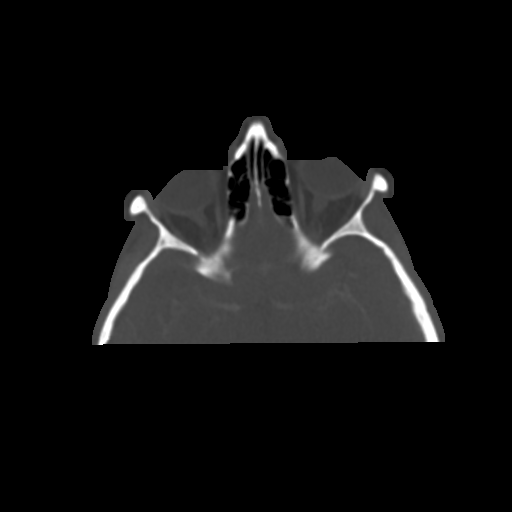

[12 of 33 positions shown; findings below may reference images not displayed]

RADIATION DOSE REDUCTION: This exam was performed according to the
departmental dose-optimization program which includes automated
exposure control, adjustment of the mA and/or kV according to
patient size and/or use of iterative reconstruction technique.

CONTRAST:  75mL OMNIPAQUE IOHEXOL 300 MG/ML  SOLN
FINDINGS: Pharynx and larynx: Enlarged palatine tonsils bilaterally. No
surrounding edema or discrete, drainable fluid collection.
Unremarkable appearance of the larynx.

Salivary glands: No inflammation, mass, or stone.

Thyroid: Normal.

Lymph nodes: Enlarged left greater than right upper cervical chain
nodes, including 1.6 x 3.1 Cm short axis left upper neck node.

Vascular: Negative.

Limited intracranial: Negative.

Visualized orbits: Negative.

Mastoids and visualized paranasal sinuses: Clear.

Skeleton: No evidence of acute abnormality.

Upper chest: Visualized lung apices are clear.
IMPRESSION: 1. Enlarged bilateral cervical chain nodes, largest being a node in
the upper left neck. Differential considerations include reactive
adenopathy versus lymphoproliferative disorder. Soft tissue sampling
may be indicated if they don't resolve with conservative management.
2. Mildly prominent palatine tonsils, which is nonspecific.
Recommend correlation with direct inspection.
